# Patient Record
Sex: Male | Born: 1995 | Race: Black or African American | Hispanic: No | Marital: Single | Smoking: Never smoker
Health system: Southern US, Community
[De-identification: ages and names within clinical notes are randomized; demographics above are authoritative.]

---

## 2019-09-03 DIAGNOSIS — M25532 Pain in left wrist: Secondary | ICD-10-CM | POA: Diagnosis not present

## 2019-09-03 DIAGNOSIS — M79605 Pain in left leg: Secondary | ICD-10-CM | POA: Diagnosis not present

## 2019-09-03 DIAGNOSIS — Y999 Unspecified external cause status: Secondary | ICD-10-CM | POA: Insufficient documentation

## 2019-09-03 DIAGNOSIS — Y92411 Interstate highway as the place of occurrence of the external cause: Secondary | ICD-10-CM | POA: Insufficient documentation

## 2019-09-03 DIAGNOSIS — Y939 Activity, unspecified: Secondary | ICD-10-CM | POA: Insufficient documentation

## 2019-09-03 DIAGNOSIS — S3991XA Unspecified injury of abdomen, initial encounter: Secondary | ICD-10-CM | POA: Insufficient documentation

## 2019-09-04 ENCOUNTER — Emergency Department (HOSPITAL_COMMUNITY)
Admission: EM | Admit: 2019-09-04 | Discharge: 2019-09-04 | Disposition: A | Discharge status: Home / Self Care | Payer: BC Managed Care – PPO | Attending: Emergency Medicine | Admitting: Emergency Medicine

## 2019-09-04 ENCOUNTER — Emergency Department (HOSPITAL_COMMUNITY): Payer: BC Managed Care – PPO

## 2019-09-04 ENCOUNTER — Other Ambulatory Visit: Payer: Self-pay

## 2019-09-04 ENCOUNTER — Encounter (HOSPITAL_COMMUNITY): Payer: Self-pay | Admitting: Emergency Medicine

## 2019-09-04 DIAGNOSIS — S3991XA Unspecified injury of abdomen, initial encounter: Secondary | ICD-10-CM | POA: Diagnosis not present

## 2019-09-04 LAB — I-STAT CHEM 8, ED
BUN: 13 mg/dL (ref 6–20)
Calcium, Ion: 1.13 mmol/L — ABNORMAL LOW (ref 1.15–1.40)
Chloride: 104 mmol/L (ref 98–111)
Creatinine, Ser: 1 mg/dL (ref 0.61–1.24)
Glucose, Bld: 86 mg/dL (ref 70–99)
HCT: 42 % (ref 39.0–52.0)
Hemoglobin: 14.3 g/dL (ref 13.0–17.0)
Potassium: 4.1 mmol/L (ref 3.5–5.1)
Sodium: 144 mmol/L (ref 135–145)
TCO2: 29 mmol/L (ref 22–32)

## 2019-09-04 MED ORDER — IOHEXOL 300 MG/ML  SOLN
100.0000 mL | Freq: Once | INTRAMUSCULAR | Status: AC | PRN
  Administered 2019-09-04: 100 mL via INTRAVENOUS

## 2019-09-04 MED ORDER — LACTATED RINGERS IV BOLUS
1000.0000 mL | Freq: Once | INTRAVENOUS | Status: AC
  Administered 2019-09-04: 1000 mL via INTRAVENOUS

## 2019-09-04 MED ORDER — FENTANYL CITRATE (PF) 100 MCG/2ML IJ SOLN
50.0000 ug | Freq: Once | INTRAMUSCULAR | Status: AC
  Administered 2019-09-04: 50 ug via INTRAVENOUS
  Filled 2019-09-04: qty 2
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
## 2019-09-04 NOTE — ED Notes (Signed)
Patient given discharge instructions. Questions were answered. Patient verbalized understanding of discharge instructions and care at home.  
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
